# Patient Record
Sex: Female | Born: 1945 | Race: White | Hispanic: No | Marital: Married | Smoking: Never smoker
Health system: Southern US, Community
[De-identification: ages and names within clinical notes are randomized; demographics above are authoritative.]

## PROBLEM LIST (undated history)

## (undated) DIAGNOSIS — J302 Other seasonal allergic rhinitis: Secondary | ICD-10-CM

## (undated) HISTORY — PX: TONSILLECTOMY: SUR1361

## (undated) HISTORY — PX: DILATION AND CURETTAGE OF UTERUS: SHX78

---

## 2009-01-24 ENCOUNTER — Encounter (INDEPENDENT_AMBULATORY_CARE_PROVIDER_SITE_OTHER): Payer: Self-pay | Admitting: *Deleted

## 2010-03-08 ENCOUNTER — Telehealth: Payer: Self-pay | Admitting: Internal Medicine

## 2010-12-19 NOTE — Progress Notes (Signed)
Summary: Schedule Colonoscopy  Phone Note Outgoing Call Call back at [facility] Phone [phone]   Call placed by: Harlow Mares CMA Duncan Dull),  March 08, 2010 3:26 PM Call placed to: Patient Summary of Call: Left message on patients machine to call back. patient is due for her colonoscopy personal hx adenomatous polyps Initial call taken by: Harlow Mares CMA Duncan Dull),  March 08, 2010 3:27 PM  Follow-up for Phone Call        Pt. returned call. Her ins. has changed since last visit and she is now being treated by [facility]. Follow-up by: Harlow Mares CMA (AAMA),  March 17, 2010 2:00 PM

## 2015-01-30 ENCOUNTER — Emergency Department (HOSPITAL_BASED_OUTPATIENT_CLINIC_OR_DEPARTMENT_OTHER)
Admission: EM | Admit: 2015-01-30 | Discharge: 2015-01-30 | Disposition: A | Discharge status: Home / Self Care | Payer: Medicare HMO | Attending: Emergency Medicine | Admitting: Emergency Medicine

## 2015-01-30 ENCOUNTER — Emergency Department (HOSPITAL_BASED_OUTPATIENT_CLINIC_OR_DEPARTMENT_OTHER): Payer: Medicare HMO

## 2015-01-30 ENCOUNTER — Encounter (HOSPITAL_BASED_OUTPATIENT_CLINIC_OR_DEPARTMENT_OTHER): Payer: Self-pay | Admitting: *Deleted

## 2015-01-30 DIAGNOSIS — B349 Viral infection, unspecified: Secondary | ICD-10-CM

## 2015-01-30 DIAGNOSIS — J40 Bronchitis, not specified as acute or chronic: Secondary | ICD-10-CM

## 2015-01-30 DIAGNOSIS — R509 Fever, unspecified: Secondary | ICD-10-CM | POA: Diagnosis present

## 2015-01-30 HISTORY — DX: Other seasonal allergic rhinitis: J30.2

## 2015-01-30 MED ORDER — ALBUTEROL SULFATE HFA 108 (90 BASE) MCG/ACT IN AERS
2.0000 | INHALATION_SPRAY | RESPIRATORY_TRACT | Status: DC | PRN
  Administered 2015-01-30: 2 via RESPIRATORY_TRACT
  Filled 2015-01-30: qty 6.7

## 2015-01-30 NOTE — ED Notes (Signed)
Cough , congestion and fever since friday

## 2015-01-30 NOTE — ED Provider Notes (Signed)
CSN: [id number]     Arrival date & time 01/30/15  1732 History   First MD Initiated Contact with Patient 01/30/15 1912     Chief Complaint  Patient presents with  . Cough  . Fever     (Consider location/radiation/quality/duration/timing/severity/associated sxs/prior Treatment) HPI  Pt presenting with c/o cough,nasal congestion and fever that began 3 days ago.  Pt states illness began with nasal congestion and fever began yesterday.  No sore throat.  No vomiting or diarrhea.  No abdominal pain.  No difficulty breathing.  She has continued drinking liquids well.  She has been taking decongestant, antihistamine and ibuprofen with some relief.  No leg swelling or chest pain.  There are no other associated systemic symptoms, there are no other alleviating or modifying factors.   Past Medical History  Diagnosis Date  . Seasonal allergies    Past Surgical History  Procedure Laterality Date  . Tonsillectomy    . Dilation and curettage of uterus     No family history on file. History  Substance Use Topics  . Smoking status: Never Smoker   . Smokeless tobacco: Never Used  . Alcohol Use: Yes     Comment: socially   OB History    No data available     Review of Systems  ROS reviewed and all otherwise negative except for mentioned in HPI    Allergies  Doxycycline and Minocycline  Home Medications   Prior to Admission medications   Not on File   BP 141/82 mmHg  Pulse 97  Temp(Src) 98.9 F (37.2 C) (Oral)  Resp 17  Ht 5\' 5"  (1.651 m)  Wt 170 lb (77.111 kg)  BMI 28.29 kg/m2  SpO2 98%  Vitals reviewed Physical Exam  Physical Examination: General appearance - alert, well appearing, and in no distress Mental status - alert, oriented to person, place, and time Eyes - no conjunctival injection, no scleral icterus Mouth - mucous membranes moist, pharynx normal without lesions Chest - clear to auscultation, no wheezes, rales or rhonchi, symmetric air entry, normal respiratory  effort Heart - normal rate, regular rhythm, normal S1, S2, no murmurs, rubs, clicks or gallops Abdomen - soft, nontender, nondistended, no masses or organomegaly Extremities - peripheral pulses normal, no pedal edema, no clubbing or cyanosis Skin - normal coloration and turgor, no rashes  ED Course  Procedures (including critical care time) Labs Review Labs Reviewed - No data to display  Imaging Review Dg Chest 2 View  01/30/2015   CLINICAL DATA:  Acute onset of cough, fever and congestion. Initial encounter.  EXAM: CHEST  2 VIEW  COMPARISON:  None.  FINDINGS: The lungs are well-aerated and clear. There is no evidence of focal opacification, pleural effusion or pneumothorax.  The heart is borderline normal in size. No acute osseous abnormalities are seen.  IMPRESSION: No acute cardiopulmonary process seen.   Electronically Signed   By: Roanna RaiderJeffery  Chang M.D.   On: 01/30/2015 19:33     EKG Interpretation None      MDM   Final diagnoses:  Viral infection  Bronchitis    Pt presenting with symptoms most c/w viral infection.  Has had symptoms over 72 hours so even if this is influenza tamiflu will not be very helpful in her case.  Discussed this with patient and she is agreeable with not taking tamiflu.  CXR reassuring.   Xray images reviewed and interpreted by me as well.   Patient is overall nontoxic and well hydrated in appearance.  Pt given albuterol inhaler to help with cough/bronchitis.  Discharged with strict return precautions.  Pt agreeable with plan.      Jerelyn Scott, MD 01/30/15 2126

## 2015-01-30 NOTE — Discharge Instructions (Signed)
Return to the ED with any concerns including difficulty breathing despite using 2 puffs of albuterol every 4 hours, not drinking fluids, decreased urine output, vomiting and not able to keep down liquids or medications, decreased level of alertness/lethargy, or any other alarming symptoms °

## 2015-01-30 NOTE — ED Notes (Signed)
Albuterol Inhaler given to patient at discharge - also given with aerochamber - explained instructions on use - states understanding and verbalized understanding through teach back.

## 2015-06-21 ENCOUNTER — Encounter: Payer: Self-pay | Admitting: Internal Medicine

## 2017-07-31 ENCOUNTER — Other Ambulatory Visit: Payer: Self-pay | Admitting: Surgical Oncology

## 2017-07-31 DIAGNOSIS — N6489 Other specified disorders of breast: Secondary | ICD-10-CM

## 2017-08-05 ENCOUNTER — Ambulatory Visit
Admission: RE | Admit: 2017-08-05 | Discharge: 2017-08-05 | Disposition: A | Discharge status: Home / Self Care | Payer: Medicare HMO | Source: Ambulatory Visit | Attending: Surgical Oncology | Admitting: Surgical Oncology

## 2017-08-05 ENCOUNTER — Other Ambulatory Visit: Payer: Self-pay | Admitting: Surgical Oncology

## 2017-08-05 DIAGNOSIS — N6489 Other specified disorders of breast: Secondary | ICD-10-CM

## 2017-12-27 ENCOUNTER — Other Ambulatory Visit: Payer: Self-pay | Admitting: Obstetrics and Gynecology

## 2017-12-27 DIAGNOSIS — N63 Unspecified lump in unspecified breast: Secondary | ICD-10-CM

## 2018-02-05 ENCOUNTER — Ambulatory Visit
Admission: RE | Admit: 2018-02-05 | Discharge: 2018-02-05 | Disposition: A | Discharge status: Home / Self Care | Payer: Medicare HMO | Source: Ambulatory Visit | Attending: Obstetrics and Gynecology | Admitting: Obstetrics and Gynecology

## 2018-02-05 ENCOUNTER — Ambulatory Visit: Payer: Medicare HMO

## 2018-02-05 DIAGNOSIS — N63 Unspecified lump in unspecified breast: Secondary | ICD-10-CM

## 2019-05-01 ENCOUNTER — Other Ambulatory Visit: Payer: Self-pay | Admitting: Internal Medicine

## 2019-05-01 DIAGNOSIS — Z1231 Encounter for screening mammogram for malignant neoplasm of breast: Secondary | ICD-10-CM

## 2019-05-04 ENCOUNTER — Ambulatory Visit
Admission: RE | Admit: 2019-05-04 | Discharge: 2019-05-04 | Disposition: A | Discharge status: Home / Self Care | Payer: Medicare HMO | Source: Ambulatory Visit | Attending: Internal Medicine | Admitting: Internal Medicine

## 2019-05-04 ENCOUNTER — Other Ambulatory Visit: Payer: Self-pay

## 2019-05-04 DIAGNOSIS — Z1231 Encounter for screening mammogram for malignant neoplasm of breast: Secondary | ICD-10-CM

## 2019-05-27 ENCOUNTER — Ambulatory Visit: Payer: Medicare HMO

## 2020-04-20 ENCOUNTER — Other Ambulatory Visit: Payer: Self-pay | Admitting: Internal Medicine

## 2020-04-20 DIAGNOSIS — Z1231 Encounter for screening mammogram for malignant neoplasm of breast: Secondary | ICD-10-CM

## 2020-05-04 ENCOUNTER — Ambulatory Visit
Admission: RE | Admit: 2020-05-04 | Discharge: 2020-05-04 | Disposition: A | Discharge status: Home / Self Care | Payer: Medicare HMO | Source: Ambulatory Visit | Attending: Internal Medicine | Admitting: Internal Medicine

## 2020-05-04 ENCOUNTER — Other Ambulatory Visit: Payer: Self-pay

## 2020-05-04 DIAGNOSIS — Z1231 Encounter for screening mammogram for malignant neoplasm of breast: Secondary | ICD-10-CM

## 2021-04-07 ENCOUNTER — Other Ambulatory Visit: Payer: Self-pay | Admitting: Internal Medicine

## 2021-04-07 DIAGNOSIS — Z1231 Encounter for screening mammogram for malignant neoplasm of breast: Secondary | ICD-10-CM

## 2021-05-08 ENCOUNTER — Ambulatory Visit: Payer: Medicare HMO

## 2021-06-28 ENCOUNTER — Ambulatory Visit: Payer: Medicare HMO

## 2021-07-11 ENCOUNTER — Ambulatory Visit: Payer: Medicare HMO

## 2021-08-09 ENCOUNTER — Other Ambulatory Visit: Payer: Self-pay

## 2021-08-09 ENCOUNTER — Ambulatory Visit
Admission: RE | Admit: 2021-08-09 | Discharge: 2021-08-09 | Disposition: A | Discharge status: Home / Self Care | Payer: Medicare HMO | Source: Ambulatory Visit | Attending: Internal Medicine | Admitting: Internal Medicine

## 2021-08-09 DIAGNOSIS — Z1231 Encounter for screening mammogram for malignant neoplasm of breast: Secondary | ICD-10-CM

## 2022-07-16 ENCOUNTER — Other Ambulatory Visit: Payer: Self-pay | Admitting: Internal Medicine

## 2022-07-16 DIAGNOSIS — Z1231 Encounter for screening mammogram for malignant neoplasm of breast: Secondary | ICD-10-CM

## 2022-08-10 ENCOUNTER — Ambulatory Visit
Admission: RE | Admit: 2022-08-10 | Discharge: 2022-08-10 | Disposition: A | Discharge status: Home / Self Care | Payer: Medicare HMO | Source: Ambulatory Visit | Attending: Internal Medicine | Admitting: Internal Medicine

## 2022-08-10 DIAGNOSIS — Z1231 Encounter for screening mammogram for malignant neoplasm of breast: Secondary | ICD-10-CM

## 2023-02-23 IMAGING — MG MM DIGITAL SCREENING BILAT W/ TOMO AND CAD
8 series · 8 of 24 positions shown · non-contrast
Comparison: Previous exam(s).

CLINICAL DATA: Screening.

EXAM:
DIGITAL SCREENING BILATERAL MAMMOGRAM WITH TOMOSYNTHESIS AND CAD
TECHNIQUE: Bilateral screening digital craniocaudal and mediolateral oblique
mammograms were obtained. Bilateral screening digital breast
tomosynthesis was performed. The images were evaluated with
computer-aided detection.

[L CC synth-2D]
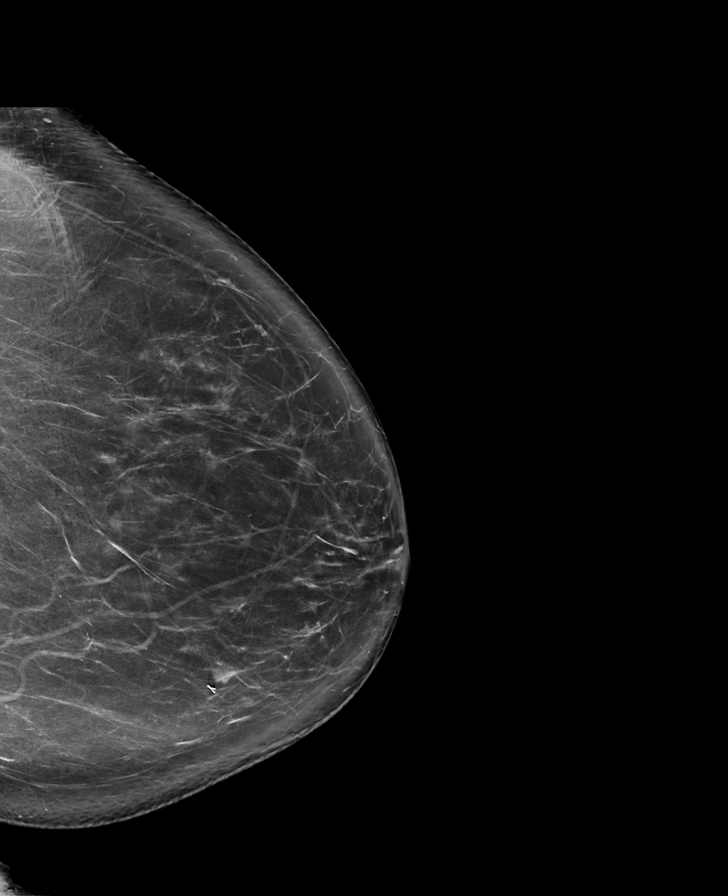

[L MLO synth-2D]
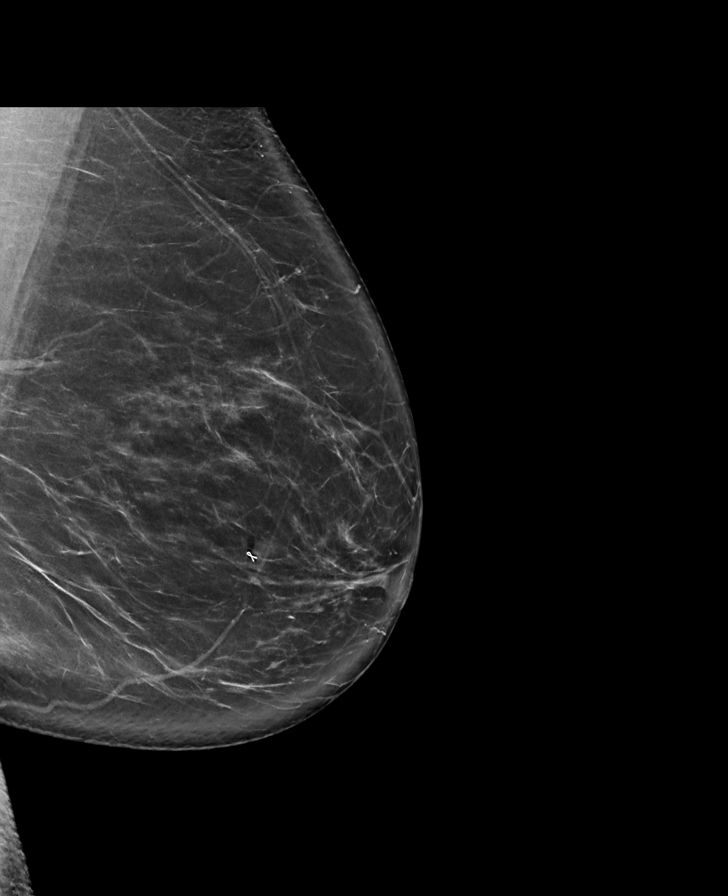

[R MLO synth-2D]
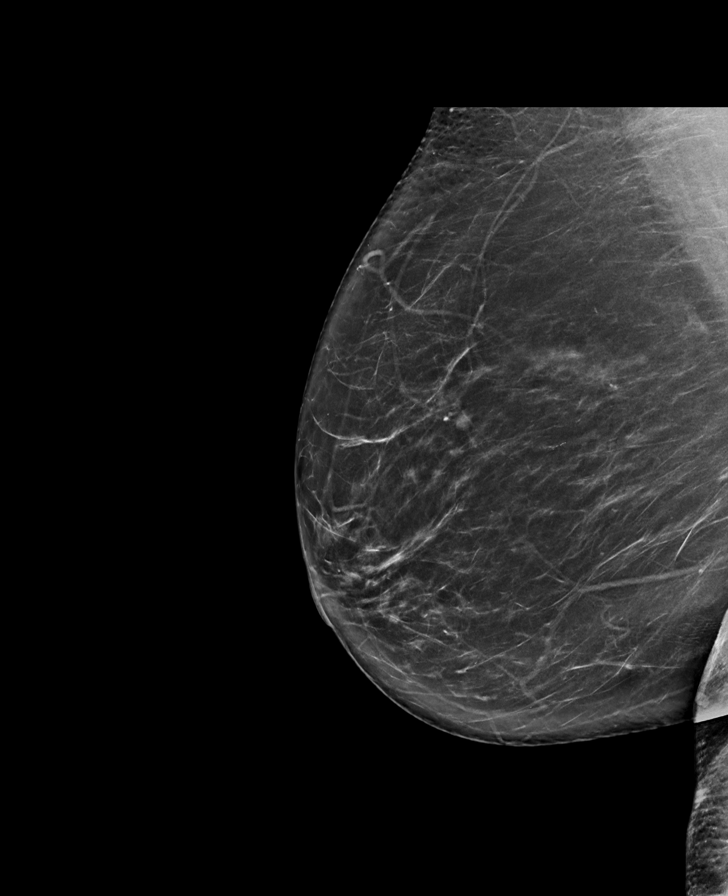

[R CC synth-2D]
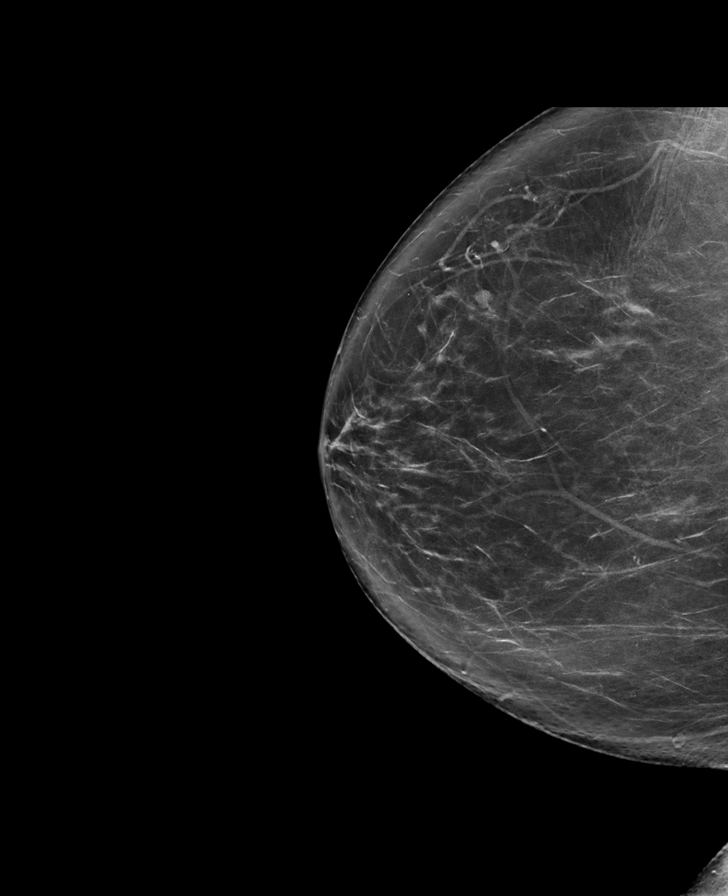

[R CC tomo · tomo slice 45/88.0]
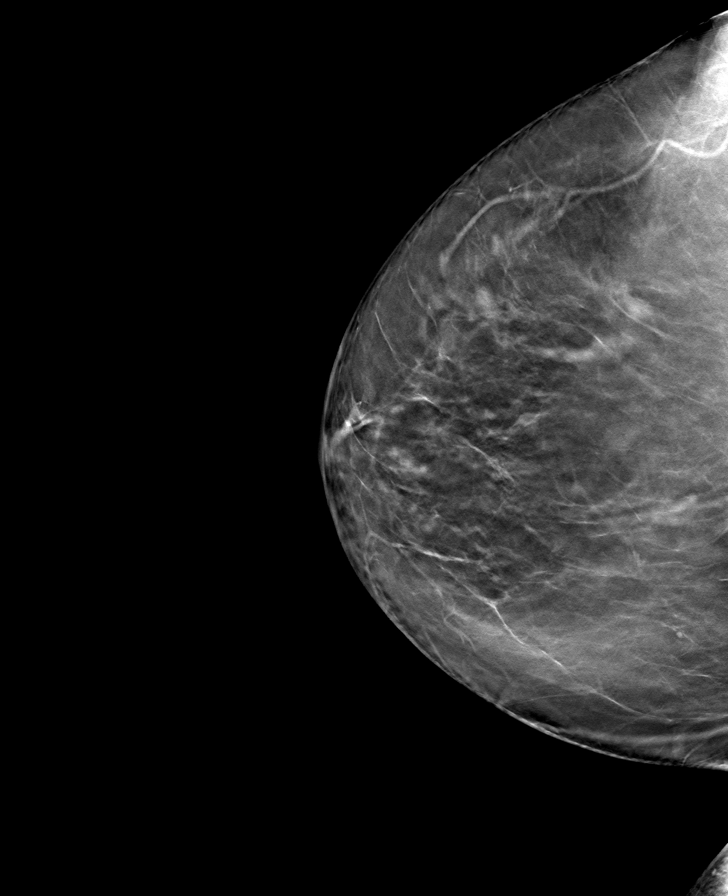

[L CC tomo · tomo slice 45/89.0]
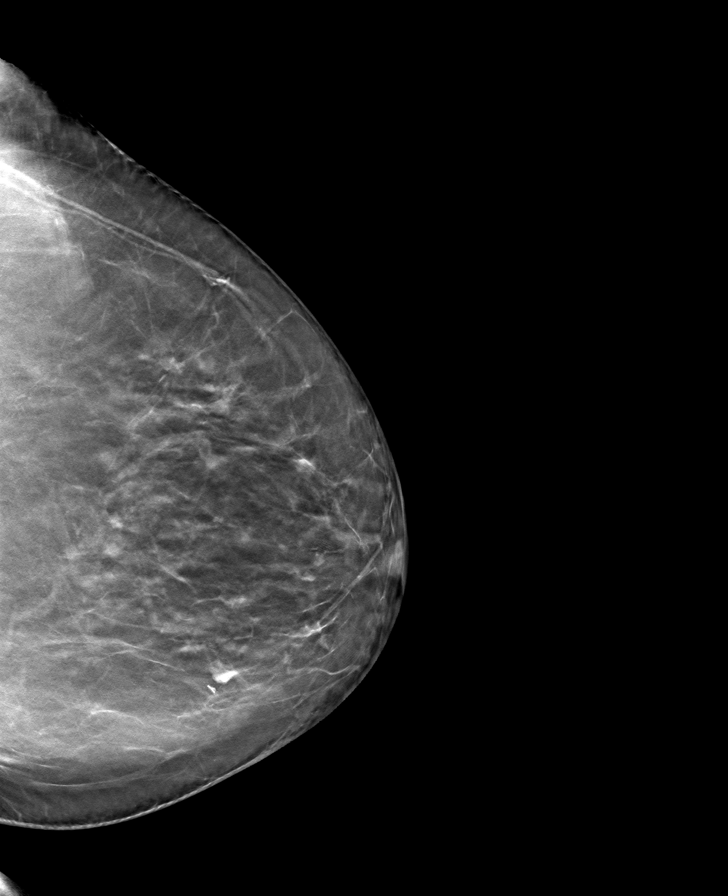

[R MLO tomo · tomo slice 43/86.0]
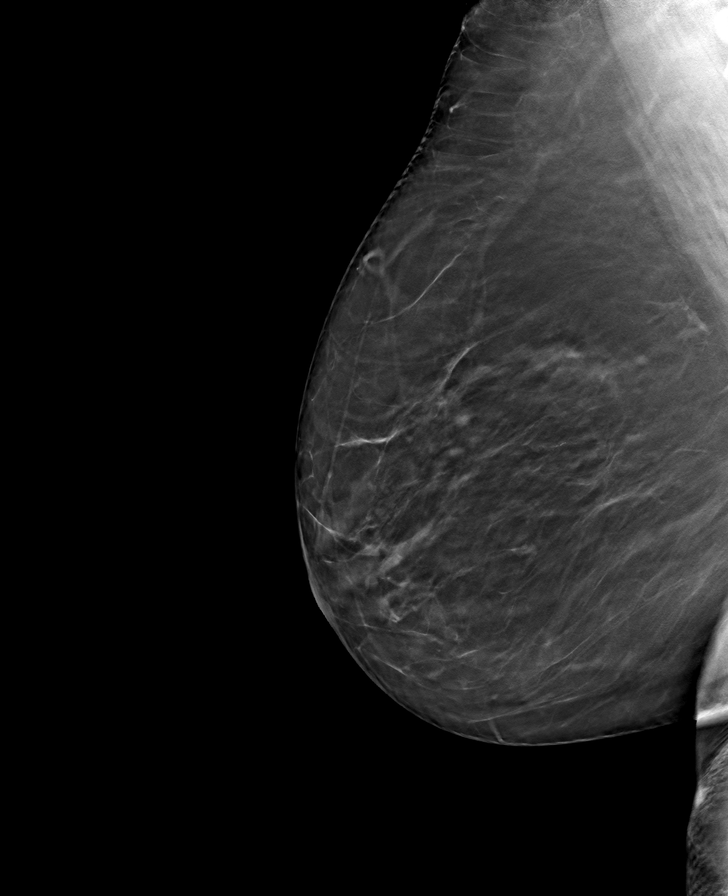

[L MLO tomo · tomo slice 44/87.0]
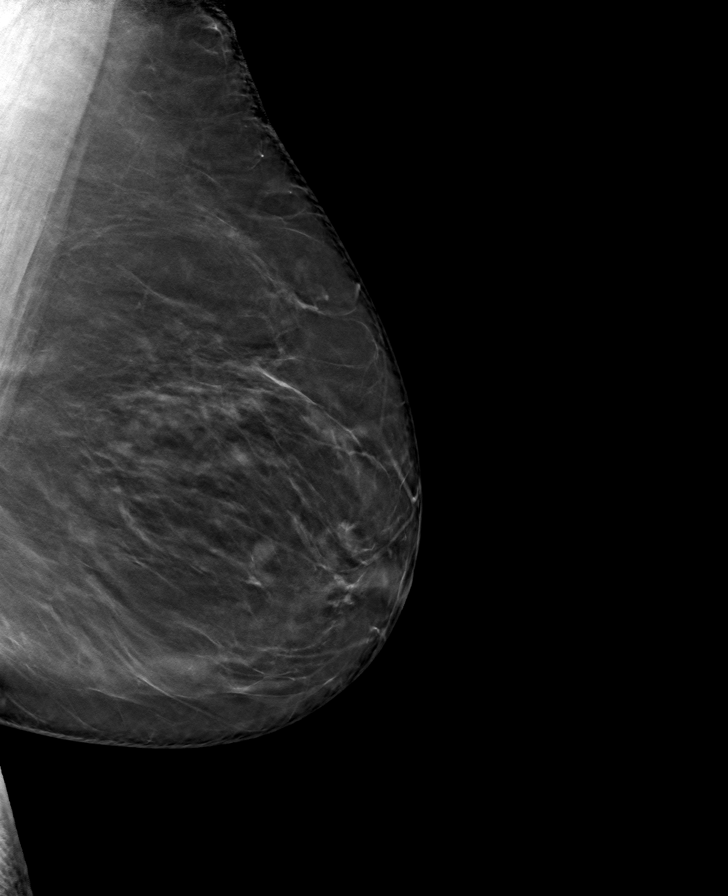

[8 of 24 positions shown; findings below may reference images not displayed]

ACR Breast Density Category b: There are scattered areas of
fibroglandular density.
FINDINGS: There are no findings suspicious for malignancy.
IMPRESSION: No mammographic evidence of malignancy. A result letter of this
screening mammogram will be mailed directly to the patient.

RECOMMENDATION:
Screening mammogram in one year. (Code:51-O-LD2)

BI-RADS CATEGORY  1: Negative.

## 2023-06-27 ENCOUNTER — Other Ambulatory Visit: Payer: Self-pay | Admitting: Internal Medicine

## 2023-06-27 DIAGNOSIS — Z1231 Encounter for screening mammogram for malignant neoplasm of breast: Secondary | ICD-10-CM

## 2023-08-12 ENCOUNTER — Ambulatory Visit
Admission: RE | Admit: 2023-08-12 | Discharge: 2023-08-12 | Disposition: A | Discharge status: Home / Self Care | Payer: Medicare HMO | Source: Ambulatory Visit | Attending: Internal Medicine | Admitting: Internal Medicine

## 2023-08-12 DIAGNOSIS — Z1231 Encounter for screening mammogram for malignant neoplasm of breast: Secondary | ICD-10-CM

## 2024-07-13 ENCOUNTER — Other Ambulatory Visit: Payer: Self-pay | Admitting: Internal Medicine

## 2024-07-13 DIAGNOSIS — Z1231 Encounter for screening mammogram for malignant neoplasm of breast: Secondary | ICD-10-CM

## 2024-08-12 ENCOUNTER — Ambulatory Visit
Admission: RE | Admit: 2024-08-12 | Discharge: 2024-08-12 | Disposition: A | Discharge status: Home / Self Care | Source: Ambulatory Visit | Attending: Internal Medicine | Admitting: Internal Medicine

## 2024-08-12 DIAGNOSIS — Z1231 Encounter for screening mammogram for malignant neoplasm of breast: Secondary | ICD-10-CM
